# Patient Record
Sex: Male | Born: 1985 | Race: White | Hispanic: No | Marital: Married | Smoking: Never smoker
Health system: Southern US, Community
[De-identification: ages and names within clinical notes are randomized; demographics above are authoritative.]

## PROBLEM LIST (undated history)

## (undated) DIAGNOSIS — K649 Unspecified hemorrhoids: Secondary | ICD-10-CM

## (undated) HISTORY — PX: APPENDECTOMY: SHX54

## (undated) HISTORY — PX: TONSILLECTOMY: SUR1361

---

## 2012-02-26 ENCOUNTER — Emergency Department (HOSPITAL_COMMUNITY)
Admission: EM | Admit: 2012-02-26 | Discharge: 2012-02-26 | Disposition: A | Discharge status: Home / Self Care | Payer: Managed Care, Other (non HMO) | Attending: Emergency Medicine | Admitting: Emergency Medicine

## 2012-02-26 ENCOUNTER — Encounter (HOSPITAL_COMMUNITY): Payer: Self-pay | Admitting: Emergency Medicine

## 2012-02-26 DIAGNOSIS — M2550 Pain in unspecified joint: Secondary | ICD-10-CM | POA: Insufficient documentation

## 2012-02-26 DIAGNOSIS — R209 Unspecified disturbances of skin sensation: Secondary | ICD-10-CM | POA: Insufficient documentation

## 2012-02-26 DIAGNOSIS — G5600 Carpal tunnel syndrome, unspecified upper limb: Secondary | ICD-10-CM

## 2012-02-26 MED ORDER — NAPROXEN 500 MG PO TABS: 500.0000 mg | ORAL_TABLET | Freq: Two times a day (BID) | ORAL | Status: DC

## 2012-02-26 NOTE — ED Notes (Addendum)
Pt c/o r pointer finger pain x2 weeks.  Denies injury.

## 2012-02-26 NOTE — ED Provider Notes (Signed)
History   This chart was scribed for non-physician practitioner working with Karl Booze, MD by Karl Whitehead, ED Scribe. This patient was seen in room WTR5/WTR5 and the patient's care was started at 4:55 PM.    CSN: [id number]  Arrival date & time 02/26/12  1539   First MD Initiated Contact with Patient 02/26/12 1632      No chief complaint on file.    The history is provided by the patient and medical records. A language interpreter was used.  Karl Whitehead is a 27 y.o. male who presents to the Emergency Department complaining of constant pain and numbness in right first and second fingers that suddenly began while cutting food one week ago.  Pt did not injure his fingers at all with the knife and has no h/o former injuries in his right hand.  Pain is worsened when he applies pressure or leans on the fingers, but pain is minimal when moving fingers with normal ROM.  He describes the sensations as if someone used "anesthesia" on his hand.  He states it feels like pins and needles and is located in the 2nd and 3rd digits of the R hand.  He is unable to rate his pain.  Pt denies any pain in the remaining fingers or thumb, no pain in palm of hand, no pain in wrist.  History reviewed. No pertinent past medical history.  History reviewed. No pertinent past surgical history.  No family history on file.  History  Substance Use Topics  . Smoking status: Never Smoker   . Smokeless tobacco: Not on file  . Alcohol Use: No      Review of Systems  Constitutional: Negative for fever, diaphoresis, appetite change, fatigue and unexpected weight change.  HENT: Negative for mouth sores and neck stiffness.   Eyes: Negative for visual disturbance.  Respiratory: Negative for cough, chest tightness, shortness of breath and wheezing.   Cardiovascular: Negative for chest pain.  Gastrointestinal: Negative for nausea, vomiting, abdominal pain, diarrhea and constipation.  Genitourinary: Negative for dysuria,  urgency, frequency and hematuria.  Musculoskeletal: Positive for arthralgias. Negative for back pain and joint swelling.  Skin: Negative for rash.  Neurological: Negative for syncope, light-headedness and headaches.  Hematological: Does not bruise/bleed easily.  Psychiatric/Behavioral: Negative for sleep disturbance. The patient is not nervous/anxious.   All other systems reviewed and are negative.    Allergies  Review of patient's allergies indicates no known allergies.  Home Medications   Current Outpatient Rx  Name  Route  Sig  Dispense  Refill  . NAPROXEN 500 MG PO TABS   Oral   Take 1 tablet (500 mg total) by mouth 2 (two) times daily with a meal.   30 tablet   0     BP 135/75  Pulse 82  Temp 97.2 F (36.2 C)  Resp 16  SpO2 98%  Physical Exam  Nursing note and vitals reviewed. Constitutional: He appears well-developed and well-nourished. No distress.  HENT:  Head: Normocephalic and atraumatic.  Mouth/Throat: Oropharynx is clear and moist. No oropharyngeal exudate.  Eyes: Conjunctivae normal are normal. No scleral icterus.  Neck: Normal range of motion. Neck supple.  Cardiovascular: Normal rate, regular rhythm, normal heart sounds and intact distal pulses.  Exam reveals no gallop and no friction rub.   No murmur heard. Pulmonary/Chest: Effort normal and breath sounds normal. No respiratory distress. He has no wheezes.  Musculoskeletal: Normal range of motion. He exhibits no edema.  Right wrist: Normal. He exhibits normal range of motion, no tenderness, no bony tenderness, no swelling, no effusion, no crepitus, no deformity and no laceration.       Right hand: He exhibits normal range of motion, no tenderness, no bony tenderness, normal two-point discrimination, normal capillary refill, no deformity, no laceration and no swelling. normal sensation noted. Normal strength noted.       Sensation is present in the radial distribution, but sensation intact   Neurological: He is alert. GCS eye subscore is 4. GCS verbal subscore is 5. GCS motor subscore is 6.  Reflex Scores:      Tricep reflexes are 2+ on the right side and 2+ on the left side.      Bicep reflexes are 2+ on the right side and 2+ on the left side.      Brachioradialis reflexes are 2+ on the right side and 2+ on the left side.      Patellar reflexes are 2+ on the right side and 2+ on the left side.      Achilles reflexes are 2+ on the right side and 2+ on the left side.      Speech is clear and goal oriented Moves extremities without ataxia Stength 5/5 in right first and second fingers, sensation intact, 2-point discrimination normal, full ROM,  positive Tinel's, negative finklestein's full ROM and no pain in wirst   Skin: Skin is warm and dry. He is not diaphoretic.       Erythematous, scaly rash consistent with eczema noted around extensor surfaces of fingers bilaterally but no other rashes noted.  Psychiatric: He has a normal mood and affect.    ED Course  Procedures (including critical care time) DIAGNOSTIC STUDIES: Oxygen Saturation is 98% on room air, normal by my interpretation.    COORDINATION OF CARE: 5:07 PM- Patient informed of clinical course, understands medical decision-making process, and agrees with plan.  1. Carpal tunnel syndrome       MDM  Karl Whitehead presents with complaints consistent with carpal Connell. Patient without lacerations. She neurologically intact with 2. discrimination and sensation. Full range of motion in the hand. Positive Tinel's sign.  Will give splint and naprosyn.  I will have pt followup with hand surgery if symptoms do not abate. I have also discussed reasons to return immediately to the ER.  Patient expresses understanding and agrees with plan.   1. Medications: naprosyn, usual home medications  2. Treatment: rest, drink plenty of fluids, use wrist splint even at night  3. Follow Up: Please followup with your primary doctor  for discussion of your diagnoses and further evaluation after today's visit; if you do not have a primary care doctor use the resource guide provided to find one; follow-up with dr Mina Marble the hand specialist  I personally performed the services described in this documentation, which was scribed in my presence. The recorded information has been reviewed and is accurate.     Dierdre Forth, PA-C 02/26/12 1731

## 2012-02-26 NOTE — Progress Notes (Signed)
Pt confirms [employer] coverage but no pcp Male in room reports moving to "area one month" Cm reviewed how to obtain an in network [employer] pcp using their toll free number on insurance card or website.  Pt with broken english and cm unable to understand if they called a doctor and was told to come to ED or if they just came independently A 62 month old daughter and a 27 year old male with him.   CM encouraged him to obtain a pcp for follow up care CM informed triage RN of pt concern confirmed 0 of the 3 had a pcp at this time

## 2012-02-26 NOTE — ED Provider Notes (Signed)
Medical screening examination/treatment/procedure(s) were performed by non-physician practitioner and as supervising physician I was immediately available for consultation/collaboration.   [name], MD 02/26/12 [phone]

## 2012-05-16 ENCOUNTER — Emergency Department (HOSPITAL_COMMUNITY)
Admission: EM | Admit: 2012-05-16 | Discharge: 2012-05-16 | Disposition: A | Discharge status: Home / Self Care | Payer: Managed Care, Other (non HMO) | Attending: Emergency Medicine | Admitting: Emergency Medicine

## 2012-05-16 ENCOUNTER — Encounter (HOSPITAL_COMMUNITY): Payer: Self-pay | Admitting: *Deleted

## 2012-05-16 DIAGNOSIS — R300 Dysuria: Secondary | ICD-10-CM | POA: Insufficient documentation

## 2012-05-16 DIAGNOSIS — R21 Rash and other nonspecific skin eruption: Secondary | ICD-10-CM | POA: Insufficient documentation

## 2012-05-16 DIAGNOSIS — K649 Unspecified hemorrhoids: Secondary | ICD-10-CM

## 2012-05-16 DIAGNOSIS — Z9852 Vasectomy status: Secondary | ICD-10-CM | POA: Insufficient documentation

## 2012-05-16 DIAGNOSIS — K6289 Other specified diseases of anus and rectum: Secondary | ICD-10-CM | POA: Insufficient documentation

## 2012-05-16 DIAGNOSIS — K648 Other hemorrhoids: Secondary | ICD-10-CM | POA: Insufficient documentation

## 2012-05-16 DIAGNOSIS — R1031 Right lower quadrant pain: Secondary | ICD-10-CM | POA: Insufficient documentation

## 2012-05-16 DIAGNOSIS — Z9089 Acquired absence of other organs: Secondary | ICD-10-CM | POA: Insufficient documentation

## 2012-05-16 HISTORY — DX: Unspecified hemorrhoids: K64.9

## 2012-05-16 LAB — COMPREHENSIVE METABOLIC PANEL
Albumin: 3.5 g/dL (ref 3.5–5.2)
BUN: 14 mg/dL (ref 6–23)
Creatinine, Ser: 0.8 mg/dL (ref 0.50–1.35)
Total Bilirubin: 0.2 mg/dL — ABNORMAL LOW (ref 0.3–1.2)
Total Protein: 7.3 g/dL (ref 6.0–8.3)

## 2012-05-16 LAB — URINALYSIS, MICROSCOPIC ONLY
Glucose, UA: NEGATIVE mg/dL
Leukocytes, UA: NEGATIVE
Protein, ur: NEGATIVE mg/dL
pH: 6 (ref 5.0–8.0)

## 2012-05-16 LAB — CBC WITH DIFFERENTIAL/PLATELET
Basophils Relative: 0 % (ref 0–1)
Eosinophils Absolute: 0.5 10*3/uL (ref 0.0–0.7)
Eosinophils Relative: 7 % — ABNORMAL HIGH (ref 0–5)
HCT: 40.3 % (ref 39.0–52.0)
Hemoglobin: 13.9 g/dL (ref 13.0–17.0)
MCH: 26.5 pg (ref 26.0–34.0)
MCHC: 34.5 g/dL (ref 30.0–36.0)
Monocytes Absolute: 0.5 10*3/uL (ref 0.1–1.0)
Monocytes Relative: 6 % (ref 3–12)

## 2012-05-16 MED ORDER — LIDOCAINE HCL 2 % EX GEL: CUTANEOUS | Status: DC | PRN

## 2012-05-16 NOTE — ED Notes (Signed)
Pt speaks Arabic and used Tax inspector for information.  Pt is alert and oriented.  Pt is here with complaint of hemorrhoids and states that with eating spicy foods the symptoms are worse.  Pt reports stomach pain and cold symptoms.  Pt points to lower abdominal pain and diarrhea.  No vomiting.

## 2012-05-16 NOTE — ED Provider Notes (Signed)
History     CSN: [id number]  Arrival date & time 05/16/12  1202   First MD Initiated Contact with Patient 05/16/12 1212      No chief complaint on file.   (Consider location/radiation/quality/duration/timing/severity/associated sxs/prior treatment) HPI Comments: Patient presenting to the ED with complaint of rectal pain and hemorrhoids. Says that his pain is constant and it is hard for him to sit. Reports that when he eats spicy food the symptoms are worse. Denies any blood in his stool. Patient also reports right lower quadrant abdominal pain. Has had a prior appendectomy and vasectomy. Patient also endorses some dysuria over the past week. Denies any hematuria, hematochezia, fever, sweats, chills, or flank pain. No history of kidney stones.  Pt has taken buscopan this am with some relief of sx.   Patient also notes rash to antecubital fossa bilaterally. States that the rash waxes and wanes, usually appearing when the weather changes. He's had this rash for some time and states that his family has a similar rash. Denies any changes in soaps, laundry detergents, or other personal care products.  Rash is pruritic.  Pt has tried OTC cortisone cream with some relief.  The history is provided by the patient. The history is limited by a language barrier. A language interpreter was used.    Past Medical History  Diagnosis Date  . Hemorrhoids     Past Surgical History  Procedure Laterality Date  . Appendectomy      No family history on file.  History  Substance Use Topics  . Smoking status: Never Smoker   . Smokeless tobacco: Not on file  . Alcohol Use: No      Review of Systems  Gastrointestinal: Positive for abdominal pain and rectal pain.  Skin: Positive for rash.  All other systems reviewed and are negative.    Allergies  Review of patient's allergies indicates no known allergies.  Home Medications   Current Outpatient Rx  Name  Route  Sig  Dispense  Refill  .  naproxen (NAPROSYN) 500 MG tablet   Oral   Take 1 tablet (500 mg total) by mouth 2 (two) times daily with a meal.   30 tablet   0     BP 132/83  Pulse 90  Temp(Src) 97.8 F (36.6 C) (Oral)  Resp 18  SpO2 96%  Physical Exam  Nursing note and vitals reviewed. Constitutional: He is oriented to person, place, and time. He appears well-developed and well-nourished.  HENT:  Head: Normocephalic and atraumatic.  Eyes: Conjunctivae and EOM are normal.  Neck: Normal range of motion. Neck supple.  Cardiovascular: Normal rate, regular rhythm and normal heart sounds.   Pulmonary/Chest: Effort normal and breath sounds normal. He has no wheezes.  Abdominal: Soft. Bowel sounds are normal. There is tenderness in the right lower quadrant. There is no guarding, no CVA tenderness, no tenderness at McBurney's point and negative Murphy's sign.  Old surgical incision in RLQ from prior appendectomy, mild TTP RLQ  Genitourinary: Rectal exam shows internal hemorrhoid and tenderness. Rectal exam shows no external hemorrhoid. Guaiac negative stool.  Musculoskeletal: Normal range of motion.  Neurological: He is alert and oriented to person, place, and time.  Skin: Skin is warm and dry. Rash noted.  Eczematous rash noted to antecubital fossa bilaterally  Psychiatric: He has a normal mood and affect.    ED Course  Procedures (including critical care time)  Labs Reviewed  CBC WITH DIFFERENTIAL - Abnormal; Notable for the following:  MCV 76.9 (*)    Neutrophils Relative 36 (*)    Lymphocytes Relative 51 (*)    Eosinophils Relative 7 (*)    All other components within normal limits  COMPREHENSIVE METABOLIC PANEL - Abnormal; Notable for the following:    Glucose, Bld 127 (*)    Total Bilirubin 0.2 (*)    All other components within normal limits  URINALYSIS, MICROSCOPIC ONLY   No results found.   1. Hemorrhoids   2. Rectal pain       MDM   Pt presenting to the ED for rectal pain and  hemorrhoids.  U/a negative for infection- low suspicion for UTI or prostatitis.  Guiac negative but exquisitely tender on DRE with noted internal hemorrhoids.  Pt notes he has had these problems for quite some time. Will refer to [facility] for further evaluation and possible colonoscopy.  Encouraged to continue using Buscopan for pain relief.  Rx lidocaine jelly.  Encouraged to limit spicy foods as this seems to trigger his flares.  Also informed that he may try stool softener to allow easier passage of BM's.  Continue applying steroid cream to rash.  Discussed with pt through language interpreter- acknowledged understanding and agreed to plan.  Return precautions advised.      Garlon Hatchet, PA-C 05/17/12 1554

## 2012-05-16 NOTE — ED Notes (Signed)
Wants to be seen for skin rash too

## 2012-05-16 NOTE — ED Notes (Signed)
Buscopan tablets taken this am and made him feel better.

## 2012-05-18 NOTE — ED Provider Notes (Signed)
Medical screening examination/treatment/procedure(s) were performed by non-physician practitioner and as supervising physician I was immediately available for consultation/collaboration.  Shelda Jakes, MD 05/18/12 [phone]

## 2012-07-13 ENCOUNTER — Encounter (HOSPITAL_COMMUNITY): Payer: Self-pay | Admitting: Emergency Medicine

## 2012-07-13 ENCOUNTER — Emergency Department (HOSPITAL_COMMUNITY)
Admission: EM | Admit: 2012-07-13 | Discharge: 2012-07-13 | Disposition: A | Discharge status: Home / Self Care | Payer: Managed Care, Other (non HMO) | Attending: Emergency Medicine | Admitting: Emergency Medicine

## 2012-07-13 DIAGNOSIS — Z8679 Personal history of other diseases of the circulatory system: Secondary | ICD-10-CM | POA: Insufficient documentation

## 2012-07-13 DIAGNOSIS — L679 Hair color and hair shaft abnormality, unspecified: Secondary | ICD-10-CM | POA: Insufficient documentation

## 2012-07-13 DIAGNOSIS — H5789 Other specified disorders of eye and adnexa: Secondary | ICD-10-CM | POA: Insufficient documentation

## 2012-07-13 DIAGNOSIS — H02056 Trichiasis without entropian left eye, unspecified eyelid: Secondary | ICD-10-CM

## 2012-07-13 MED ORDER — FLUORESCEIN SODIUM 1 MG OP STRP
1.0000 | ORAL_STRIP | Freq: Once | OPHTHALMIC | Status: AC
  Administered 2012-07-13: 1 via OPHTHALMIC
  Filled 2012-07-13: qty 1

## 2012-07-13 MED ORDER — TETRACAINE HCL 0.5 % OP SOLN
1.0000 [drp] | Freq: Once | OPHTHALMIC | Status: AC
  Administered 2012-07-13: 1 [drp] via OPHTHALMIC
  Filled 2012-07-13: qty 2

## 2012-07-13 NOTE — ED Notes (Signed)
Pt. Stated, eye pain, I think a hair is in there.

## 2012-07-13 NOTE — ED Provider Notes (Signed)
History     CSN: [id number]  Arrival date & time 07/13/12  1224   First MD Initiated Contact with Patient 07/13/12 1340      Chief Complaint  Patient presents with  . Eye Pain    (Consider location/radiation/quality/duration/timing/severity/associated sxs/prior treatment) HPI Comments: Patient presents for L eye irritation x 2 days which has been constant without aggravating or alleviating factors. Patient states this has happened before and he was found to have an eyelash in his eye. Patient admits to associated eye redness and tearing. He denies vision changes or loss and fevers.  The history is provided by the patient. The history is limited by a language barrier. A language interpreter was used.    Past Medical History  Diagnosis Date  . Hemorrhoids     Past Surgical History  Procedure Laterality Date  . Appendectomy      No family history on file.  History  Substance Use Topics  . Smoking status: Never Smoker   . Smokeless tobacco: Not on file  . Alcohol Use: No     Review of Systems  Constitutional: Negative for fever.  HENT: Negative for ear pain, congestion, rhinorrhea, sinus pressure and ear discharge.   Eyes: Positive for discharge (watery) and redness. Negative for visual disturbance.  Skin: Negative for color change.  All other systems reviewed and are negative.    Allergies  Review of patient's allergies indicates no known allergies.  Home Medications  No current outpatient prescriptions on file.  BP 114/70  Pulse 87  Temp(Src) 98.8 F (37.1 C) (Oral)  Resp 18  SpO2 97%  Physical Exam  Nursing note and vitals reviewed. Constitutional: He is oriented to person, place, and time. He appears well-developed and well-nourished. No distress.  HENT:  Head: Normocephalic and atraumatic.  Right Ear: Tympanic membrane, external ear and ear canal normal.  Left Ear: Tympanic membrane, external ear and ear canal normal.  Nose: Nose normal. Right  sinus exhibits no maxillary sinus tenderness and no frontal sinus tenderness. Left sinus exhibits no maxillary sinus tenderness and no frontal sinus tenderness.  Mouth/Throat: Uvula is midline, oropharynx is clear and moist and mucous membranes are normal.  Eyes: EOM are normal. Pupils are equal, round, and reactive to light. Right eye exhibits no discharge. No foreign body present in the right eye. Left eye exhibits discharge (watery). Foreign body (single eyelash growing inward toward conjunctiva.) present in the left eye. Right conjunctiva is not injected. Right conjunctiva has no hemorrhage. Left conjunctiva is injected. Left conjunctiva has no hemorrhage. No scleral icterus. Right eye exhibits normal extraocular motion. Left eye exhibits normal extraocular motion.  Fluorescein staining without uptake; no corneal abrasions or ulcerations appreciated.  Neck: Normal range of motion. Neck supple.  Cardiovascular: Normal rate, regular rhythm and intact distal pulses.   Pulmonary/Chest: Effort normal. No respiratory distress.  Lymphadenopathy:    He has no cervical adenopathy.  Neurological: He is alert and oriented to person, place, and time.  Skin: Skin is warm and dry. No rash noted. He is not diaphoretic. No erythema. No pallor.  Psychiatric: He has a normal mood and affect. His behavior is normal.    ED Course  FOREIGN BODY REMOVAL Date/Time: 07/13/2012 2:30 PM Performed by: Antony Madura Authorized by: Antony Madura Consent: Verbal consent obtained. written consent not obtained. Risks and benefits: risks, benefits and alternatives were discussed Consent given by: patient Patient understanding: patient states understanding of the procedure being performed Patient consent: the patient's understanding of  the procedure matches consent given Procedure consent: procedure consent matches procedure scheduled Relevant documents: relevant documents present and verified Test results: test results  available and properly labeled Site marked: the operative site was marked Required blood products, implants, devices, and special equipment available: forceps. Patient identity confirmed: verbally with patient and arm band Time out: Immediately prior to procedure a "time out" was called to verify the correct patient, procedure, equipment, support staff and site/side marked as required. Body area: eye Location details: left eyelid Anesthesia method: None. Local anesthetic: None. Anesthetic total (ml): n/a. Anesthetic total (drops): n/a. Patient sedated: no Patient restrained: no Patient cooperative: yes Localization method: eyelid eversion Removal mechanism: eyelash removed from eyelid with forceps. Eye examined with fluorescein. No fluorescein uptake. Corneal abrasion size: none. Corneal abrasion location: none. No residual rust ring present. Dressing: none. Complexity: simple 1 objects recovered. Foreign bodies recovered: eyelash. Patient tolerance: Patient tolerated the procedure well with no immediate complications. Comments: Verbal consent obtained. L upper eyelid everted to expose trichiasis of single eyelash. Sterile forceps from I&D kit used to grasp eyelash. Eyelash pulled and removed with swift, single tug. Fluorescein post procedure without uptake or evidence of corneal abrasion. Patient tolerated procedure well without immediate complications.   (including critical care time)  Labs Reviewed - No data to display No results found.   1. Trichiasis of eyelid, left     MDM  Uncomplicated trichiasis of L eyelid. Eyelash removed in ED without complications. No evidence of corneal abrasion or ulceration on fluorescein staining. Patient tolerated procedure well without complications. Topical relief with saline eyedrops advised as needed. F/u with PCP advised and resource guide given. Indications for ED return discussed with patient who verbalizes comfort and understanding with  d/c plan with no unaddressed concerns.        Antony Madura, [place] 07/18/12 [phone]

## 2012-07-13 NOTE — ED Notes (Signed)
Pt reports sensation of left eye irritation, foreign body.

## 2012-07-20 NOTE — ED Provider Notes (Signed)
Medical screening examination/treatment/procedure(s) were performed by non-physician practitioner and as supervising physician I was immediately available for consultation/collaboration.   Carleene Cooper III, MD 07/20/12 [phone]

## 2012-11-25 ENCOUNTER — Encounter (HOSPITAL_COMMUNITY): Payer: Self-pay | Admitting: Emergency Medicine

## 2012-11-25 DIAGNOSIS — R1084 Generalized abdominal pain: Secondary | ICD-10-CM | POA: Insufficient documentation

## 2012-11-25 DIAGNOSIS — K5289 Other specified noninfective gastroenteritis and colitis: Secondary | ICD-10-CM | POA: Insufficient documentation

## 2012-11-25 DIAGNOSIS — Z8679 Personal history of other diseases of the circulatory system: Secondary | ICD-10-CM | POA: Insufficient documentation

## 2012-11-25 NOTE — ED Notes (Addendum)
Pt states that his daughter was at day care and she brought home a virus. Pt states that for 2 days he has been having diarrhea and he ha been having abdominal pain. pt states generalized pain.

## 2012-11-26 ENCOUNTER — Emergency Department (HOSPITAL_COMMUNITY)
Admission: EM | Admit: 2012-11-26 | Discharge: 2012-11-26 | Disposition: A | Discharge status: Home / Self Care | Payer: Managed Care, Other (non HMO) | Attending: Emergency Medicine | Admitting: Emergency Medicine

## 2012-11-26 DIAGNOSIS — K529 Noninfective gastroenteritis and colitis, unspecified: Secondary | ICD-10-CM

## 2012-11-26 LAB — COMPREHENSIVE METABOLIC PANEL
ALT: 29 U/L (ref 0–53)
AST: 18 U/L (ref 0–37)
Albumin: 3.9 g/dL (ref 3.5–5.2)
Alkaline Phosphatase: 86 U/L (ref 39–117)
Chloride: 101 mEq/L (ref 96–112)
Potassium: 3.9 mEq/L (ref 3.5–5.1)
Sodium: 136 mEq/L (ref 135–145)
Total Bilirubin: 0.3 mg/dL (ref 0.3–1.2)

## 2012-11-26 LAB — URINALYSIS, ROUTINE W REFLEX MICROSCOPIC
Bilirubin Urine: NEGATIVE
Glucose, UA: NEGATIVE mg/dL
Ketones, ur: NEGATIVE mg/dL
pH: 6 (ref 5.0–8.0)

## 2012-11-26 LAB — CBC WITH DIFFERENTIAL/PLATELET
Basophils Absolute: 0 10*3/uL (ref 0.0–0.1)
Basophils Relative: 0 % (ref 0–1)
Hemoglobin: 15.4 g/dL (ref 13.0–17.0)
MCHC: 35.1 g/dL (ref 30.0–36.0)
Monocytes Relative: 10 % (ref 3–12)
Neutro Abs: 2.7 10*3/uL (ref 1.7–7.7)
Neutrophils Relative %: 33 % — ABNORMAL LOW (ref 43–77)
RDW: 12.7 % (ref 11.5–15.5)

## 2012-11-26 MED ORDER — ONDANSETRON 4 MG PO TBDP: 4.0000 mg | ORAL_TABLET | Freq: Three times a day (TID) | ORAL | Status: AC | PRN

## 2012-11-26 MED ORDER — SODIUM CHLORIDE 0.9 % IV BOLUS (SEPSIS)
1000.0000 mL | Freq: Once | INTRAVENOUS | Status: AC
  Administered 2012-11-26: 1000 mL via INTRAVENOUS

## 2012-11-26 MED ORDER — MORPHINE SULFATE 4 MG/ML IJ SOLN
4.0000 mg | Freq: Once | INTRAMUSCULAR | Status: AC
  Administered 2012-11-26: 4 mg via INTRAVENOUS
  Filled 2012-11-26: qty 1

## 2012-11-26 MED ORDER — MORPHINE SULFATE 4 MG/ML IJ SOLN
2.0000 mg | Freq: Once | INTRAMUSCULAR | Status: AC
  Administered 2012-11-26: 2 mg via INTRAVENOUS
  Filled 2012-11-26: qty 1

## 2012-11-26 MED ORDER — ONDANSETRON HCL 4 MG/2ML IJ SOLN
4.0000 mg | Freq: Once | INTRAMUSCULAR | Status: AC
  Administered 2012-11-26: 4 mg via INTRAVENOUS
  Filled 2012-11-26: qty 2

## 2012-11-26 MED ORDER — HYDROCODONE-ACETAMINOPHEN 5-325 MG PO TABS: 1.0000 | ORAL_TABLET | Freq: Four times a day (QID) | ORAL | Status: AC | PRN

## 2012-11-26 NOTE — ED Provider Notes (Signed)
CSN: [id number]     Arrival date & time 11/25/12  2331 History   First MD Initiated Contact with Patient 11/26/12 0203     Chief Complaint  Patient presents with  . Abdominal Pain   (Consider location/radiation/quality/duration/timing/severity/associated sxs/prior Treatment) HPI Comments: Patient is a 27 yo M PMHx significant for hemorrhoids presenting to the ED for two days of moderate generalized cramping abdominal pain w/ associated nausea, nonbloody nonbilious emesis, nonbloody diarrhea. Patient states his abdominal pain is radiating to the right side of his back. He endorses that his daughter is sick at home with similar symptoms. He denies any alleviating or aggravating factors. He denies any fevers, urinary symptoms, CP, SOB, cough. Patient's abdominal surgical history includes appendectomy.   Patient is a 27 y.o. male presenting with abdominal pain.  Abdominal Pain Associated symptoms: diarrhea, nausea and vomiting   Associated symptoms: no constipation and no fever     Past Medical History  Diagnosis Date  . Hemorrhoids    Past Surgical History  Procedure Laterality Date  . Appendectomy    . Tonsillectomy     History reviewed. No pertinent family history. History  Substance Use Topics  . Smoking status: Never Smoker   . Smokeless tobacco: Not on file  . Alcohol Use: No    Review of Systems  Constitutional: Negative for fever.  Gastrointestinal: Positive for nausea, vomiting, abdominal pain and diarrhea. Negative for constipation, blood in stool, abdominal distention and anal bleeding.  All other systems reviewed and are negative.    Allergies  Review of patient's allergies indicates no known allergies.  Home Medications   Current Outpatient Rx  Name  Route  Sig  Dispense  Refill  . OVER THE COUNTER MEDICATION   Oral   Take 1-2 tablets by mouth every 6 (six) hours as needed (for pain     medication  is from [place]).         Marland Kitchen  HYDROcodone-acetaminophen (NORCO/VICODIN) 5-325 MG per tablet   Oral   Take 1 tablet by mouth every 6 (six) hours as needed for pain.   6 tablet   0   . ondansetron (ZOFRAN ODT) 4 MG disintegrating tablet   Oral   Take 1 tablet (4 mg total) by mouth every 8 (eight) hours as needed for nausea.   10 tablet   0    BP 125/72  Pulse 68  Temp(Src) 98.3 F (36.8 C) (Oral)  Resp 23  SpO2 99% Physical Exam  Constitutional: He is oriented to person, place, and time. He appears well-developed and well-nourished. No distress.  HENT:  Head: Normocephalic and atraumatic.  Right Ear: External ear normal.  Left Ear: External ear normal.  Nose: Nose normal.  Mouth/Throat: Uvula is midline. Mucous membranes are dry. No oropharyngeal exudate or tonsillar abscesses.  Eyes: Conjunctivae are normal.  Neck: Neck supple.  Cardiovascular: Normal rate, regular rhythm and normal heart sounds.   Pulmonary/Chest: Effort normal and breath sounds normal. No respiratory distress.  Abdominal: Soft. Bowel sounds are normal. He exhibits no distension. There is generalized tenderness. There is no rigidity, no rebound, no guarding, no CVA tenderness, no tenderness at McBurney's point and negative Murphy's sign.  Musculoskeletal: Normal range of motion. He exhibits no edema.  Neurological: He is alert and oriented to person, place, and time.  Skin: Skin is warm and dry. He is not diaphoretic.    ED Course  Procedures (including critical care time)  Medications  sodium chloride 0.9 % bolus  1,000 mL (0 mLs Intravenous Stopped 11/26/12 0400)  morphine 4 MG/ML injection 4 mg (4 mg Intravenous Given 11/26/12 0235)  ondansetron (ZOFRAN) injection 4 mg (4 mg Intravenous Given 11/26/12 0235)  morphine 4 MG/ML injection 2 mg (2 mg Intravenous Given 11/26/12 0436)    Labs Review Labs Reviewed  CBC WITH DIFFERENTIAL - Abnormal; Notable for the following:    Neutrophils Relative % 33 (*)    Lymphocytes Relative 50 (*)     Eosinophils Relative 6 (*)    All other components within normal limits  COMPREHENSIVE METABOLIC PANEL - Abnormal; Notable for the following:    Glucose, Bld 106 (*)    All other components within normal limits  LIPASE, BLOOD  URINALYSIS, ROUTINE W REFLEX MICROSCOPIC   Imaging Review No results found.  EKG Interpretation   None       MDM   1. Gastroenteritis     Afebrile, NAD, non-toxic appearing, AAOx4. Patient is nontoxic, nonseptic appearing, in no apparent distress.  Patient's pain and other symptoms adequately managed in emergency department.  Fluid bolus given. Labs  and vitals reviewed.  Patient does not meet the SIRS or Sepsis criteria.  On repeat exam patient does not have a surgical abdomen and there are nor peritoneal signs. Likely gastroenteritis. Patient discharged home with symptomatic treatment and given strict instructions for follow-up with their primary care physician.  I have also discussed reasons to return immediately to the ER.  Patient expresses understanding and agrees with plan. Patient is stable at time of discharge        Jeannetta Ellis, PA-C 11/26/12 1610

## 2012-11-26 NOTE — ED Provider Notes (Signed)
Medical screening examination/treatment/procedure(s) were performed by non-physician practitioner and as supervising physician I was immediately available for consultation/collaboration.   Zamauri Nez, MD 11/26/12 2300 

## 2013-02-02 ENCOUNTER — Other Ambulatory Visit: Payer: Self-pay | Admitting: Family Medicine

## 2013-02-02 ENCOUNTER — Ambulatory Visit
Admission: RE | Admit: 2013-02-02 | Discharge: 2013-02-02 | Disposition: A | Discharge status: Home / Self Care | Payer: Managed Care, Other (non HMO) | Source: Ambulatory Visit | Attending: Family Medicine | Admitting: Family Medicine

## 2013-02-02 ENCOUNTER — Ambulatory Visit (HOSPITAL_COMMUNITY): Payer: Managed Care, Other (non HMO)

## 2013-02-02 DIAGNOSIS — M545 Low back pain, unspecified: Secondary | ICD-10-CM

## 2014-07-14 IMAGING — CR DG SACRUM/COCCYX 2+V
3 series · 3 of 3 positions shown · non-contrast
Comparison: None.

CLINICAL DATA: Fell on a chair 6 years ago, no recent trauma,
complaining of low back pain and sacrum and coccyx

EXAM:
SACRUM AND COCCYX - 2+ VIEW

[view not recorded (1 of 3)]
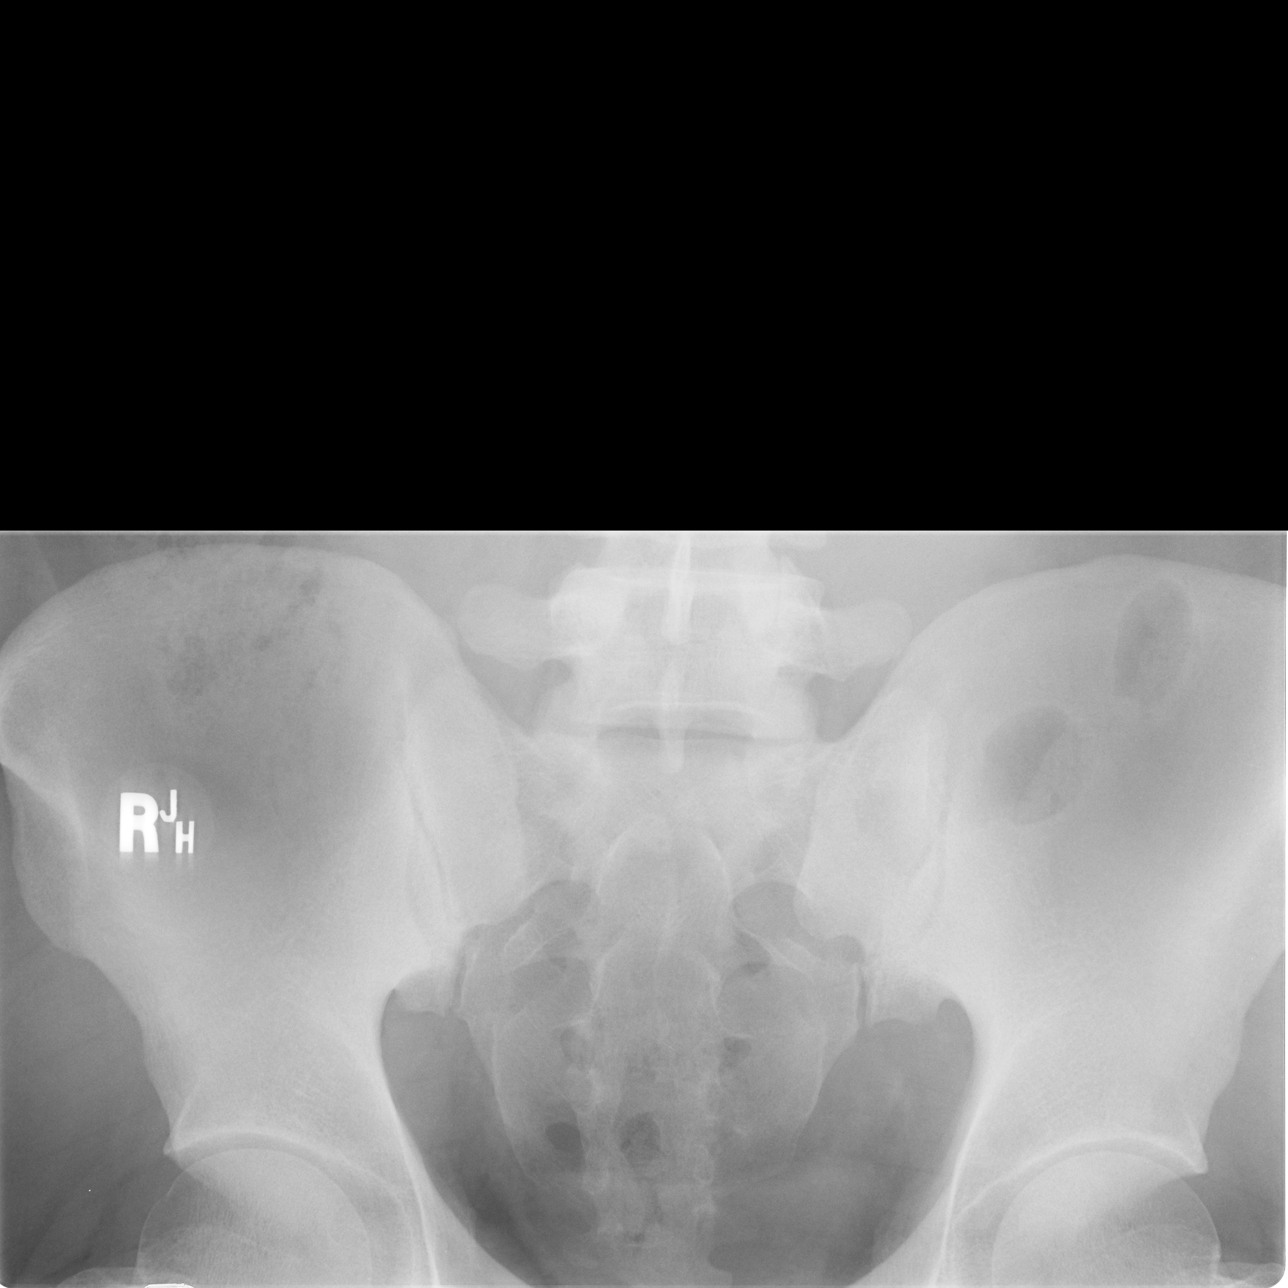

[view not recorded (2 of 3)]
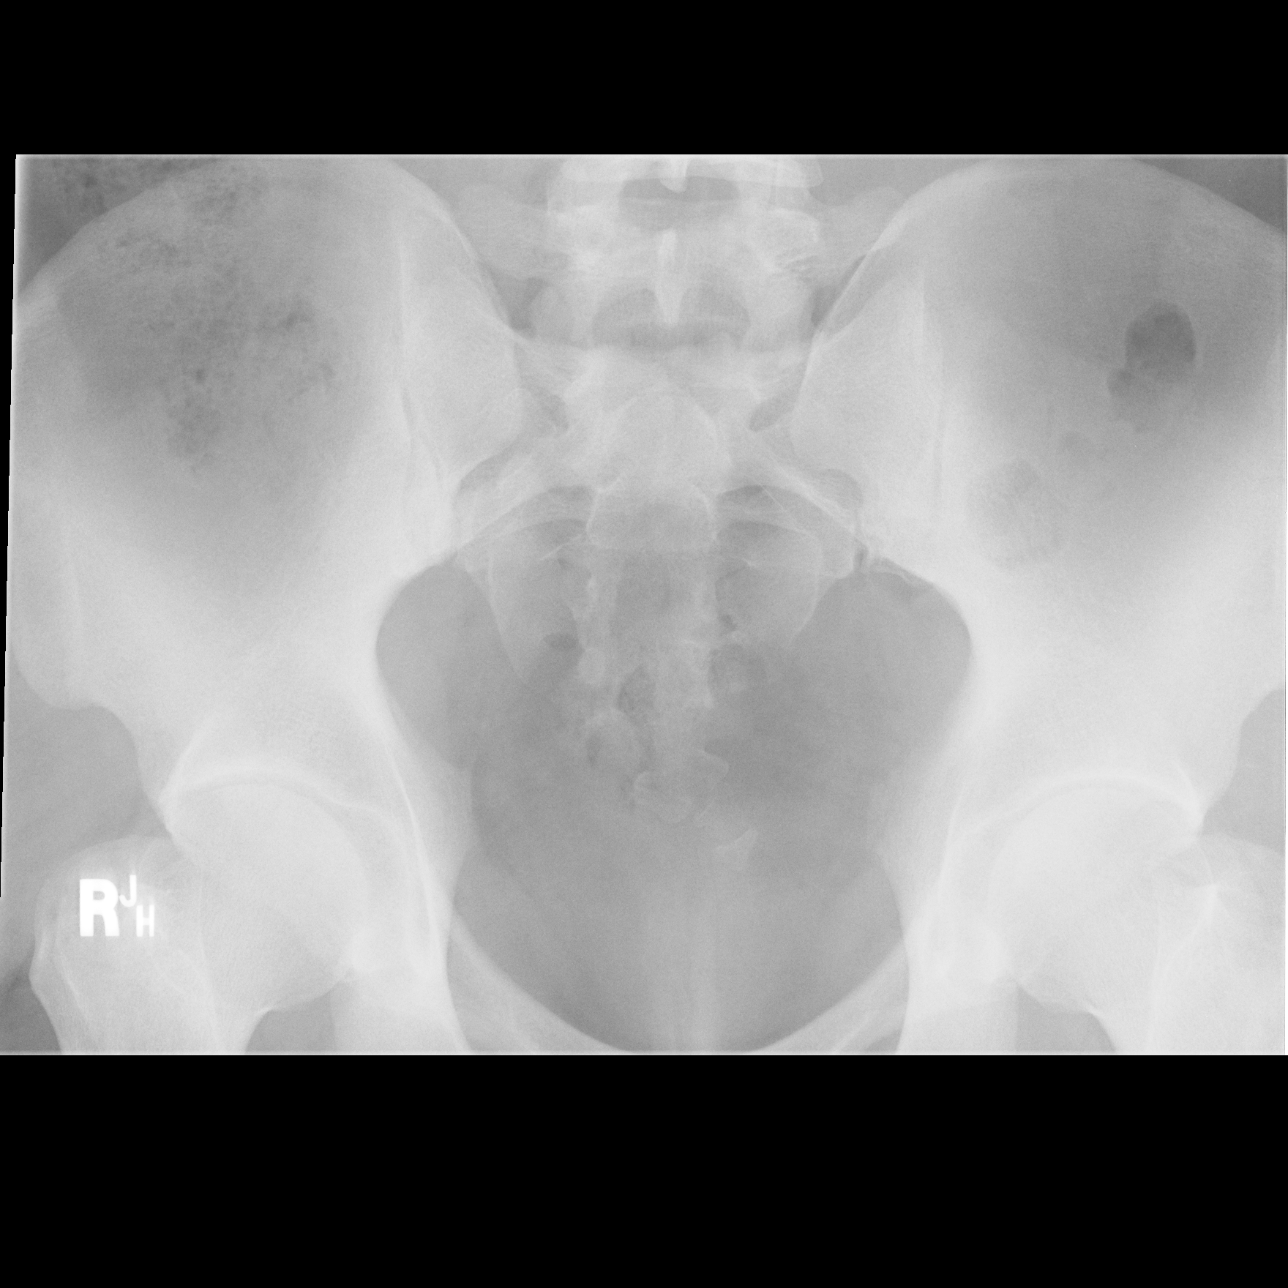

[view not recorded (3 of 3)]
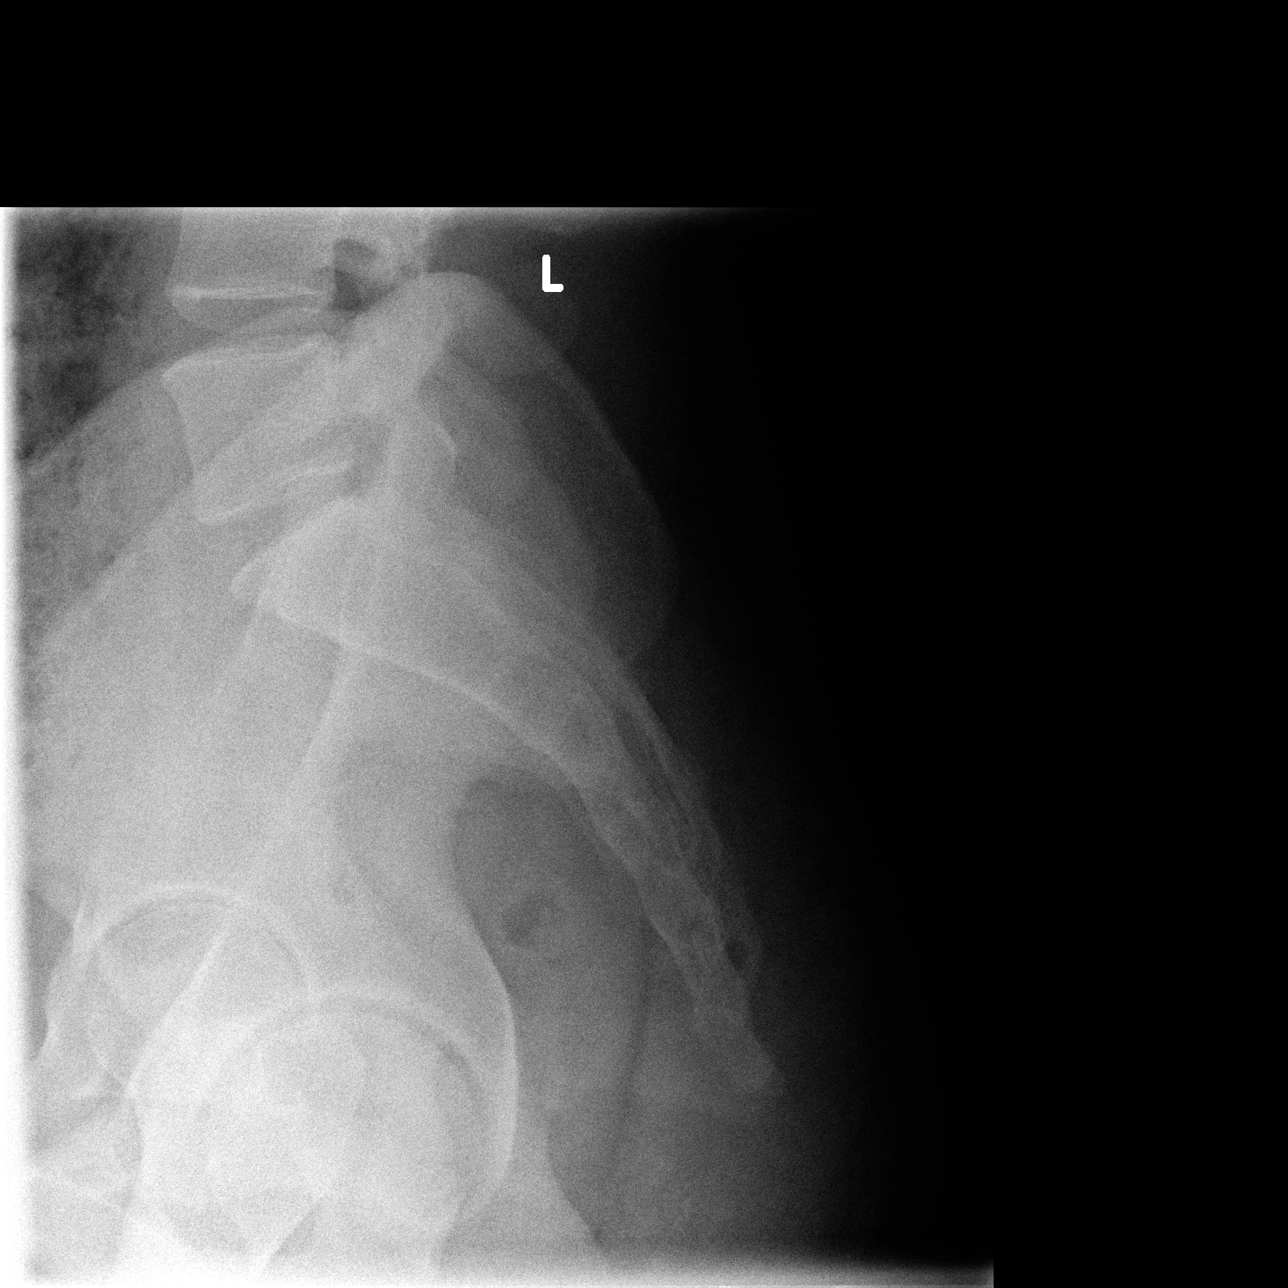

[3 of 3 positions shown; findings below may reference images not displayed]

FINDINGS: There is no evidence of fracture or other focal bone lesions
IMPRESSION: Negative.
